# Patient Record
Sex: Female | Born: 2000 | Race: Black or African American | Hispanic: No | Marital: Single | Smoking: Never smoker
Health system: Southern US, Community
[De-identification: ages and names within clinical notes are randomized; demographics above are authoritative.]

## PROBLEM LIST (undated history)

## (undated) DIAGNOSIS — M419 Scoliosis, unspecified: Secondary | ICD-10-CM

## (undated) HISTORY — PX: TUMOR REMOVAL: SHX12

---
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
## 2019-02-09 DIAGNOSIS — N898 Other specified noninflammatory disorders of vagina: Secondary | ICD-10-CM | POA: Diagnosis not present

## 2019-04-02 DIAGNOSIS — Z0000 Encounter for general adult medical examination without abnormal findings: Secondary | ICD-10-CM | POA: Diagnosis not present

## 2019-04-02 DIAGNOSIS — N898 Other specified noninflammatory disorders of vagina: Secondary | ICD-10-CM | POA: Diagnosis not present

## 2019-04-02 DIAGNOSIS — L2082 Flexural eczema: Secondary | ICD-10-CM | POA: Diagnosis not present

## 2019-04-02 DIAGNOSIS — Z3009 Encounter for other general counseling and advice on contraception: Secondary | ICD-10-CM | POA: Diagnosis not present

## 2019-04-05 DIAGNOSIS — Z20828 Contact with and (suspected) exposure to other viral communicable diseases: Secondary | ICD-10-CM | POA: Diagnosis not present

## 2019-04-18 DIAGNOSIS — Z20828 Contact with and (suspected) exposure to other viral communicable diseases: Secondary | ICD-10-CM | POA: Diagnosis not present

## 2019-04-25 DIAGNOSIS — N761 Subacute and chronic vaginitis: Secondary | ICD-10-CM | POA: Diagnosis not present

## 2019-10-09 DIAGNOSIS — B9689 Other specified bacterial agents as the cause of diseases classified elsewhere: Secondary | ICD-10-CM | POA: Diagnosis not present

## 2019-10-09 DIAGNOSIS — Z1159 Encounter for screening for other viral diseases: Secondary | ICD-10-CM | POA: Diagnosis not present

## 2019-10-09 DIAGNOSIS — N760 Acute vaginitis: Secondary | ICD-10-CM | POA: Diagnosis not present

## 2020-01-06 DIAGNOSIS — Z20822 Contact with and (suspected) exposure to covid-19: Secondary | ICD-10-CM | POA: Diagnosis not present

## 2020-02-14 DIAGNOSIS — Z20822 Contact with and (suspected) exposure to covid-19: Secondary | ICD-10-CM | POA: Diagnosis not present

## 2020-03-10 DIAGNOSIS — N760 Acute vaginitis: Secondary | ICD-10-CM | POA: Diagnosis not present

## 2020-03-10 DIAGNOSIS — N898 Other specified noninflammatory disorders of vagina: Secondary | ICD-10-CM | POA: Diagnosis not present

## 2020-03-10 DIAGNOSIS — B9689 Other specified bacterial agents as the cause of diseases classified elsewhere: Secondary | ICD-10-CM | POA: Diagnosis not present

## 2020-03-23 DIAGNOSIS — Z20822 Contact with and (suspected) exposure to covid-19: Secondary | ICD-10-CM | POA: Diagnosis not present

## 2020-03-23 DIAGNOSIS — Z03818 Encounter for observation for suspected exposure to other biological agents ruled out: Secondary | ICD-10-CM | POA: Diagnosis not present

## 2020-03-24 DIAGNOSIS — T7840XA Allergy, unspecified, initial encounter: Secondary | ICD-10-CM | POA: Diagnosis not present

## 2020-03-24 DIAGNOSIS — L509 Urticaria, unspecified: Secondary | ICD-10-CM | POA: Diagnosis not present

## 2020-04-01 DIAGNOSIS — Z0000 Encounter for general adult medical examination without abnormal findings: Secondary | ICD-10-CM | POA: Diagnosis not present

## 2020-04-01 DIAGNOSIS — R636 Underweight: Secondary | ICD-10-CM | POA: Diagnosis not present

## 2020-04-08 DIAGNOSIS — N760 Acute vaginitis: Secondary | ICD-10-CM | POA: Diagnosis not present

## 2020-04-15 DIAGNOSIS — N761 Subacute and chronic vaginitis: Secondary | ICD-10-CM | POA: Diagnosis not present

## 2020-07-08 DIAGNOSIS — Z20822 Contact with and (suspected) exposure to covid-19: Secondary | ICD-10-CM | POA: Diagnosis not present

## 2020-10-24 DIAGNOSIS — Z1159 Encounter for screening for other viral diseases: Secondary | ICD-10-CM | POA: Diagnosis not present

## 2020-10-24 DIAGNOSIS — N898 Other specified noninflammatory disorders of vagina: Secondary | ICD-10-CM | POA: Diagnosis not present

## 2020-11-06 DIAGNOSIS — Z20822 Contact with and (suspected) exposure to covid-19: Secondary | ICD-10-CM | POA: Diagnosis not present

## 2021-04-03 ENCOUNTER — Other Ambulatory Visit (HOSPITAL_COMMUNITY)
Admission: RE | Admit: 2021-04-03 | Discharge: 2021-04-03 | Disposition: A | Discharge status: Home / Self Care | Payer: BC Managed Care – PPO | Source: Ambulatory Visit | Attending: Family Medicine | Admitting: Family Medicine

## 2021-04-03 ENCOUNTER — Other Ambulatory Visit: Payer: Self-pay | Admitting: Family Medicine

## 2021-04-03 DIAGNOSIS — Z01411 Encounter for gynecological examination (general) (routine) with abnormal findings: Secondary | ICD-10-CM | POA: Insufficient documentation

## 2021-04-03 DIAGNOSIS — Z0000 Encounter for general adult medical examination without abnormal findings: Secondary | ICD-10-CM | POA: Diagnosis not present

## 2021-04-03 DIAGNOSIS — Z124 Encounter for screening for malignant neoplasm of cervix: Secondary | ICD-10-CM | POA: Diagnosis not present

## 2021-04-09 LAB — CYTOLOGY - PAP
Adequacy: ABSENT
Comment: NEGATIVE
High risk HPV: NEGATIVE

## 2021-09-07 DIAGNOSIS — Z113 Encounter for screening for infections with a predominantly sexual mode of transmission: Secondary | ICD-10-CM | POA: Diagnosis not present

## 2021-09-07 DIAGNOSIS — Z1159 Encounter for screening for other viral diseases: Secondary | ICD-10-CM | POA: Diagnosis not present

## 2021-09-10 ENCOUNTER — Ambulatory Visit
Admission: RE | Admit: 2021-09-10 | Discharge: 2021-09-10 | Disposition: A | Discharge status: Home / Self Care | Payer: BC Managed Care – PPO | Source: Ambulatory Visit | Attending: Family Medicine | Admitting: Family Medicine

## 2021-09-10 ENCOUNTER — Other Ambulatory Visit: Payer: Self-pay | Admitting: Family Medicine

## 2021-09-10 DIAGNOSIS — R1033 Periumbilical pain: Secondary | ICD-10-CM

## 2021-09-10 DIAGNOSIS — N3289 Other specified disorders of bladder: Secondary | ICD-10-CM | POA: Diagnosis not present

## 2021-09-10 DIAGNOSIS — M4186 Other forms of scoliosis, lumbar region: Secondary | ICD-10-CM | POA: Diagnosis not present

## 2021-09-10 DIAGNOSIS — R188 Other ascites: Secondary | ICD-10-CM | POA: Diagnosis not present

## 2021-09-10 DIAGNOSIS — K769 Liver disease, unspecified: Secondary | ICD-10-CM | POA: Diagnosis not present

## 2021-09-10 IMAGING — CT CT ABD-PELV W/ CM
1 of 2 series · 13 of 32 positions shown, 18 images · IV contrast (APPLIED)
Comparison: None.

CLINICAL DATA: Periumbilical pain for 2 days

EXAM:
CT ABDOMEN AND PELVIS WITH CONTRAST
TECHNIQUE: Multidetector CT imaging of the abdomen and pelvis was performed
using the standard protocol following bolus administration of
intravenous contrast.

[Series 2: abd/pelvis w/cm · axial · 0.62mm/px · z∈[-496,-116]mm · 13 of 88 slices shown, 18 images]
[im 6/88  soft-tissue]
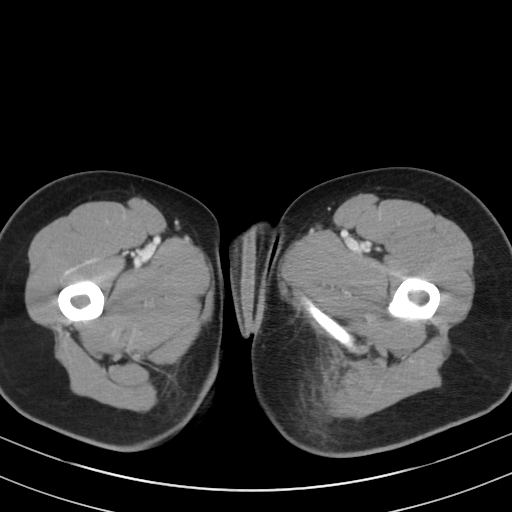
[im 6/88  bone]
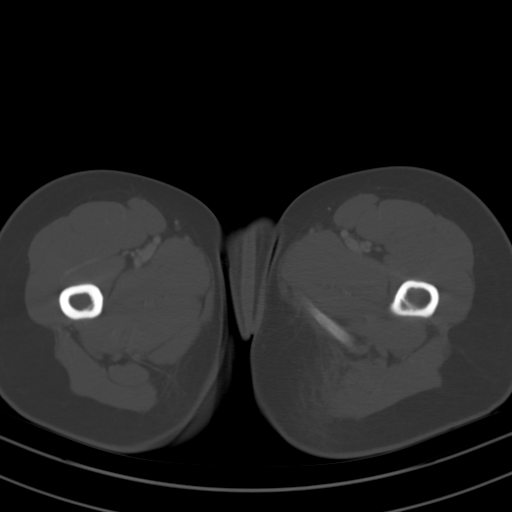
[im 11/88  soft-tissue]
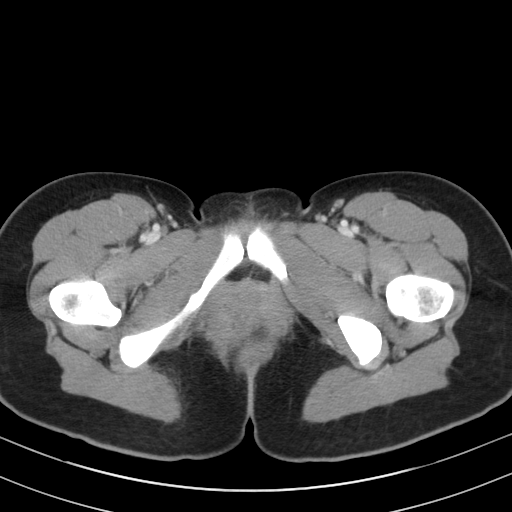
[im 22/88  soft-tissue]
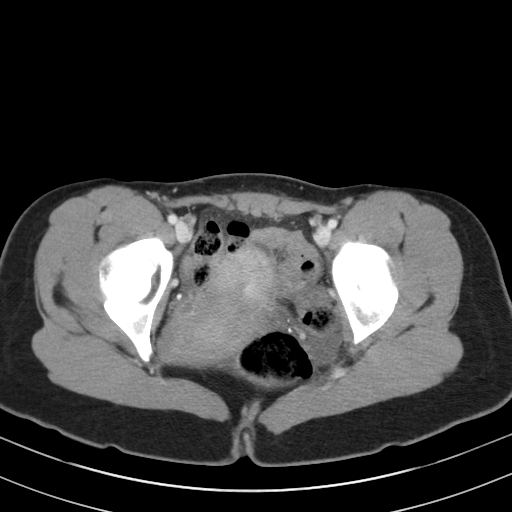
[im 28/88  soft-tissue]
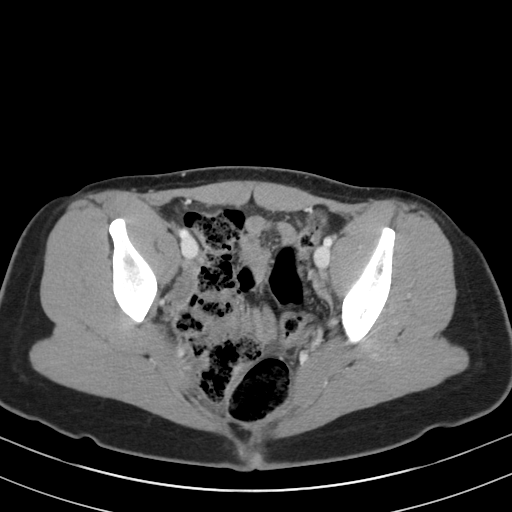
[im 33/88  soft-tissue]
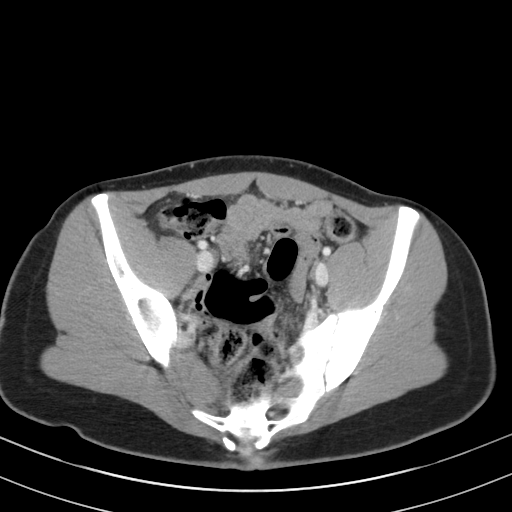
[im 39/88  soft-tissue]
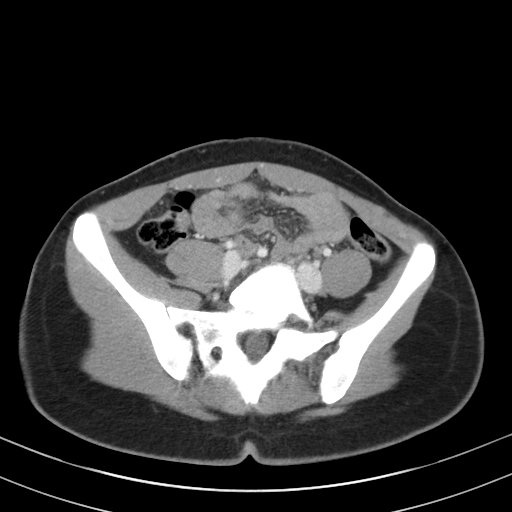
[im 49/88  soft-tissue]
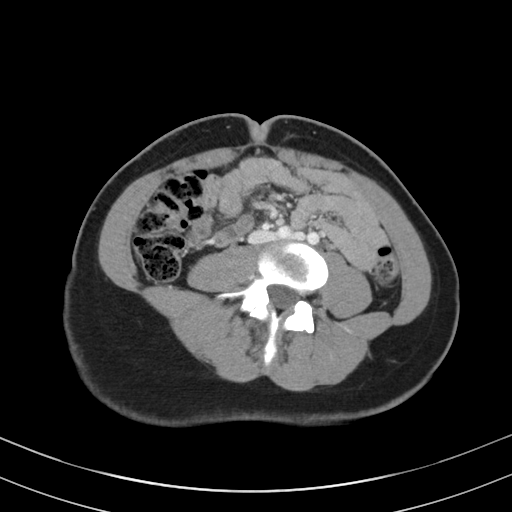
[im 55/88  soft-tissue]
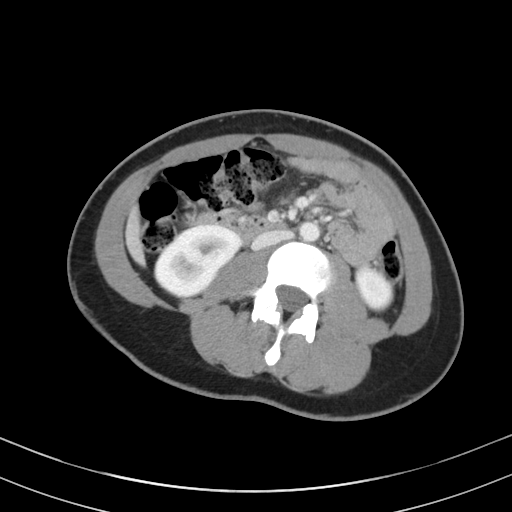
[im 60/88  soft-tissue]
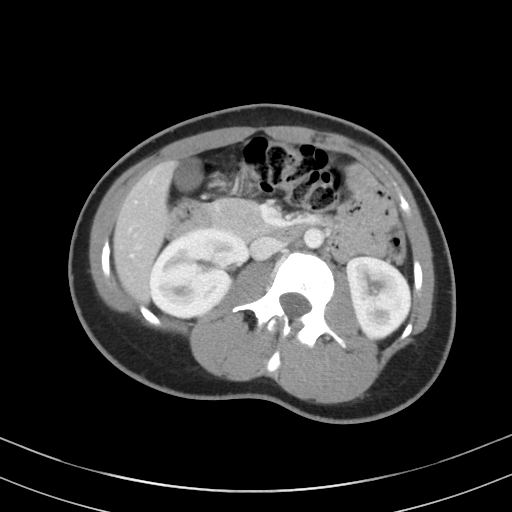
[im 60/88  bone]
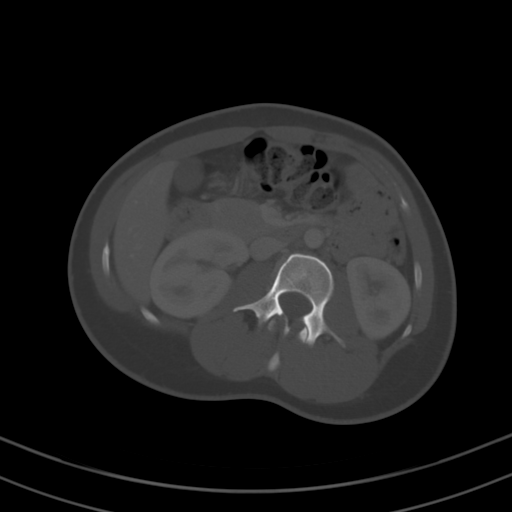
[im 66/88  soft-tissue]
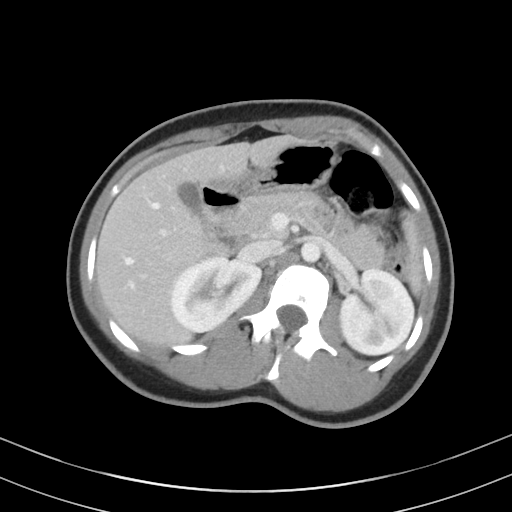
[im 66/88  lung]
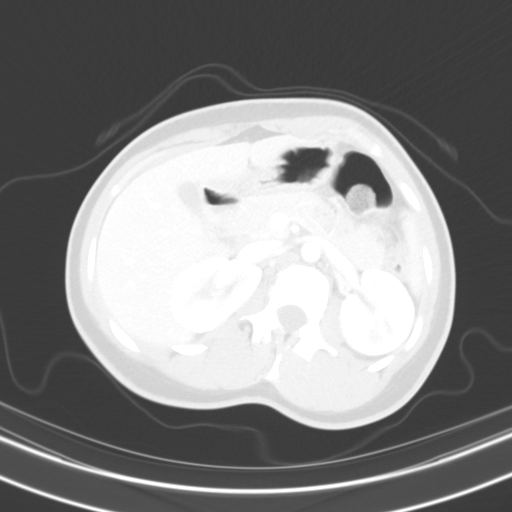
[im 71/88  lung]
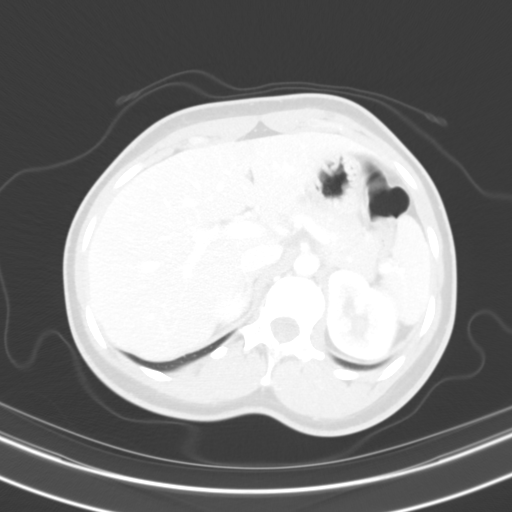
[im 77/88  soft-tissue]
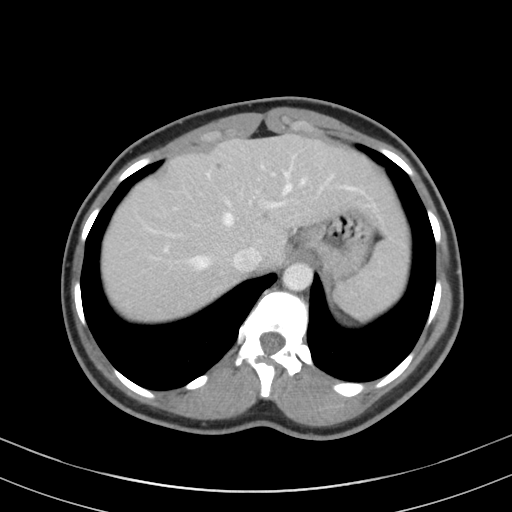
[im 77/88  lung]
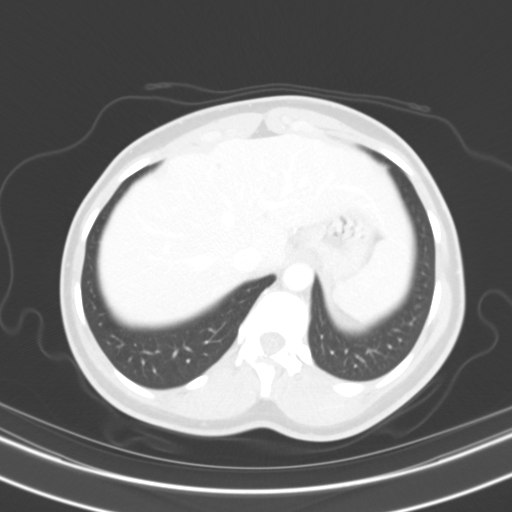
[im 82/88  soft-tissue]
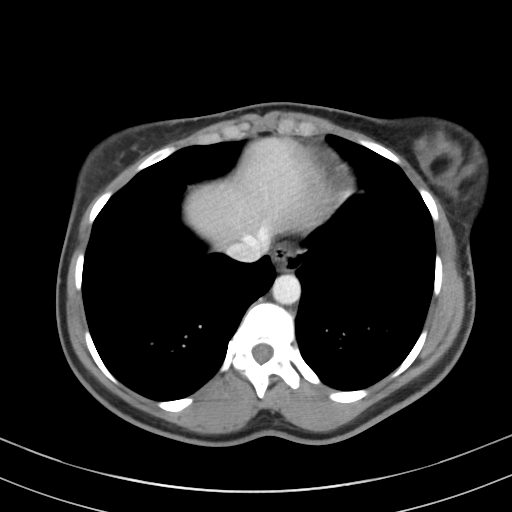
[im 82/88  lung]
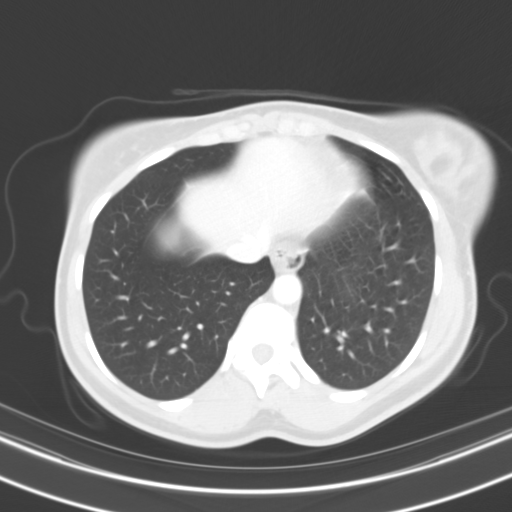

[13 of 32 positions shown; findings below may reference images not displayed]

RADIATION DOSE REDUCTION: This exam was performed according to the
departmental dose-optimization program which includes automated
exposure control, adjustment of the mA and/or kV according to
patient size and/or use of iterative reconstruction technique.

CONTRAST:  100mL [WP] IOPAMIDOL ([WP]) INJECTION 61%
FINDINGS: Lower chest: The lung bases are clear. The imaged heart is
unremarkable.

Hepatobiliary: There are scattered subcentimeter hypodense lesions
in the liver which are too small to characterize but may reflect
small cysts. No specific imaging follow-up is required. There are no
suspicious lesions. The gallbladder is unremarkable. There is no
biliary ductal dilatation.

Pancreas: Unremarkable.

Spleen: Unremarkable.

Adrenals/Urinary Tract: The adrenals are unremarkable.

The kidneys are unremarkable, with no focal lesion, stone,
hydronephrosis, or hydroureter. The bladder is decompressed but the
wall appears mildly thickened.

Stomach/Bowel: The stomach is unremarkable. There is no evidence of
bowel obstruction. There is no abnormal bowel wall thickening or
inflammatory change. Appendix is normal.

Vascular/Lymphatic: The abdominal aorta is normal in course and
caliber. The major branch vessels are patent. The main portal and
splenic veins are patent. There is no abdominopelvic
lymphadenopathy.

Reproductive: The uterus is unremarkable.  There is no adnexal mass.

Other: There is small volume free fluid in the pelvis. There is no
organized or drainable fluid collection. There is no free
intraperitoneal air.

Musculoskeletal: There is marked levoscoliosis centered at L2. There
is no acute osseous abnormality or aggressive osseous lesion.
IMPRESSION: 1. Mild bladder wall thickening may reflect underdistention, though
cystitis can not be excluded. Correlate with urinalysis.
2. Small volume free fluid in the pelvis is nonspecific and may be
physiologic given patient age.
3. Otherwise, no acute findings in the abdomen or pelvis.
4. Marked levoscoliosis centered at L2.

## 2021-09-10 MED ORDER — IOPAMIDOL (ISOVUE-300) INJECTION 61%
100.0000 mL | Freq: Once | INTRAVENOUS | Status: AC | PRN
  Administered 2021-09-10: 100 mL via INTRAVENOUS

## 2021-12-02 DIAGNOSIS — N9089 Other specified noninflammatory disorders of vulva and perineum: Secondary | ICD-10-CM | POA: Diagnosis not present

## 2021-12-02 DIAGNOSIS — Z113 Encounter for screening for infections with a predominantly sexual mode of transmission: Secondary | ICD-10-CM | POA: Diagnosis not present

## 2021-12-02 DIAGNOSIS — Z1159 Encounter for screening for other viral diseases: Secondary | ICD-10-CM | POA: Diagnosis not present

## 2022-04-05 ENCOUNTER — Other Ambulatory Visit (HOSPITAL_COMMUNITY)
Admission: RE | Admit: 2022-04-05 | Discharge: 2022-04-05 | Disposition: A | Discharge status: Home / Self Care | Payer: BC Managed Care – PPO | Source: Ambulatory Visit | Attending: Family Medicine | Admitting: Family Medicine

## 2022-04-05 ENCOUNTER — Other Ambulatory Visit: Payer: Self-pay | Admitting: Family Medicine

## 2022-04-05 DIAGNOSIS — Z01411 Encounter for gynecological examination (general) (routine) with abnormal findings: Secondary | ICD-10-CM | POA: Insufficient documentation

## 2022-04-15 LAB — CYTOLOGY - PAP
Comment: NEGATIVE
Diagnosis: NEGATIVE
High risk HPV: NEGATIVE

## 2022-05-25 ENCOUNTER — Other Ambulatory Visit: Payer: Self-pay

## 2022-05-25 ENCOUNTER — Emergency Department (HOSPITAL_BASED_OUTPATIENT_CLINIC_OR_DEPARTMENT_OTHER)
Admission: EM | Admit: 2022-05-25 | Discharge: 2022-05-25 | Disposition: A | Discharge status: Home / Self Care | Payer: BC Managed Care – PPO | Attending: Emergency Medicine | Admitting: Emergency Medicine

## 2022-05-25 ENCOUNTER — Encounter (HOSPITAL_BASED_OUTPATIENT_CLINIC_OR_DEPARTMENT_OTHER): Payer: Self-pay | Admitting: Emergency Medicine

## 2022-05-25 ENCOUNTER — Emergency Department (HOSPITAL_BASED_OUTPATIENT_CLINIC_OR_DEPARTMENT_OTHER): Payer: BC Managed Care – PPO | Admitting: Radiology

## 2022-05-25 DIAGNOSIS — R053 Chronic cough: Secondary | ICD-10-CM

## 2022-05-25 DIAGNOSIS — R0982 Postnasal drip: Secondary | ICD-10-CM | POA: Insufficient documentation

## 2022-05-25 DIAGNOSIS — R0981 Nasal congestion: Secondary | ICD-10-CM | POA: Insufficient documentation

## 2022-05-25 DIAGNOSIS — J3489 Other specified disorders of nose and nasal sinuses: Secondary | ICD-10-CM | POA: Insufficient documentation

## 2022-05-25 DIAGNOSIS — R059 Cough, unspecified: Secondary | ICD-10-CM | POA: Diagnosis present

## 2022-05-25 DIAGNOSIS — R058 Other specified cough: Secondary | ICD-10-CM | POA: Diagnosis not present

## 2022-05-25 DIAGNOSIS — Z20822 Contact with and (suspected) exposure to covid-19: Secondary | ICD-10-CM | POA: Insufficient documentation

## 2022-05-25 DIAGNOSIS — J029 Acute pharyngitis, unspecified: Secondary | ICD-10-CM | POA: Insufficient documentation

## 2022-05-25 LAB — RESP PANEL BY RT-PCR (RSV, FLU A&B, COVID)  RVPGX2
Influenza A by PCR: NEGATIVE
Influenza B by PCR: NEGATIVE
Resp Syncytial Virus by PCR: NEGATIVE
SARS Coronavirus 2 by RT PCR: NEGATIVE

## 2022-05-25 LAB — PREGNANCY, URINE: Preg Test, Ur: NEGATIVE

## 2022-05-25 MED ORDER — GUAIFENESIN-CODEINE 100-10 MG/5ML PO SOLN: 5.0000 mL | Freq: Three times a day (TID) | ORAL | 0 refills | Status: AC | PRN

## 2022-05-25 MED ORDER — FAMOTIDINE 20 MG PO TABS: 20.0000 mg | ORAL_TABLET | Freq: Every evening | ORAL | 0 refills | Status: AC

## 2022-05-25 MED ORDER — PROMETHAZINE-CODEINE 6.25-10 MG/5ML PO SYRP: 5.0000 mL | ORAL_SOLUTION | Freq: Four times a day (QID) | ORAL | 0 refills | Status: DC | PRN

## 2022-05-25 NOTE — ED Triage Notes (Signed)
Pt arrives to ED with c/o cough x2 weeks. No fevers, chills, night sweats, pain.

## 2022-05-25 NOTE — ED Notes (Signed)
Discharge instructions, follow up care, and prescription reviewed and explained, pt verbalized understanding. Pt caox4 and ambulatory on d/c.  

## 2022-05-25 NOTE — Discharge Instructions (Addendum)
You were seen in the emergency department today for persistent cough.  We have tested you for covid, flu, and RSV and these results were negative.  Your chest X-ray did not show pneumonia or bronchitis.  However, it is still likely that your symptoms are related to a different viral illness.   Treatment is directed at relieving symptoms.  There is no cure for acute viral infections; it is usually best to let them run their course.  Symptoms usually last around 1-2 weeks, though cough may occasionally linger for up to 3-4 weeks.  As discussed, antibiotics are not effective with viral infections, because the infection is caused by a virus, not by bacteria.   Treatments may include:  Increased fluid intake. Sports drinks offer valuable electrolytes, sugars, and fluids.  Breathing heated mist or steam (vaporizer or shower).  Eating chicken soup or other clear broths, and maintaining good nutrition.  Getting plenty of rest.  Using lozenges, tea with honey, or warm salt water for cough relief/sore throat relief (Cepkaol or Halls lozenges are available over-the-counter) Increasing usage of your inhaler if you have asthma.   Take over-the-counter Benadryl, Zyrtec, or Claritin to decrease sinus secretions, with Mucinex to help break up remaining mucus Continue to alternate between Tylenol and ibuprofen for pain and fever control.  A prescription for additional cough suppressant therapy and for an antacid has been sent to the pharmacy.  Please use as directed, and do not drive or operate machinery after using the cough suppressant medication as it can cause drowsiness and has opioid medication within.  Please follow up with your primary care doctor this week for recheck of ongoing symptoms.  May consider follow up with pulmonology for consideration of post-viral cough syndrome of further evaluation as needed.  Return to emergency department for emergent changing or worsening of symptoms.

## 2022-05-25 NOTE — ED Notes (Signed)
Out to XR 

## 2022-05-25 NOTE — ED Provider Notes (Signed)
[facility] EMERGENCY DEPT Provider Note   CSN: [id number] Arrival date & time: 05/25/22  1412     History  Chief Complaint  Patient presents with   Cough    NYKEA JACEK is a 21 y.o. female presenting to the ED with chief complaint of prolonged cough over the last 2 weeks.  Denies preceding or earlier URI symptoms such as congestion, sore throat, runny nose, postnasal drip.  No known recent sick contacts.  States cough varies between productive and dry.  When productive, mucus is a whitish-green in color.  Denies abdominal pain, N/V/D, chills, fevers, or neck stiffness.  Denies headache or sinus pressure.  Has tried Mucinex, Robitussin, NyQuil, DayQuil without relief.  No Hx of asthma, denies wheezing or shortness of breath.  Denies chest pain.  No other complaints at this time.  The history is provided by the patient and medical records.  Cough      Home Medications Prior to Admission medications   Medication Sig Start Date End Date Taking? Authorizing Provider  famotidine (PEPCID) 20 MG tablet Take 1 tablet (20 mg total) by mouth at bedtime. 05/25/22  Yes Cecil Cobbs, PA-C  guaiFENesin-codeine 100-10 MG/5ML syrup Take 5 mLs by mouth 3 (three) times daily as needed for cough. 05/25/22  Yes Cecil Cobbs, PA-C      Allergies    Pineapple    Review of Systems   Review of Systems  Respiratory:  Positive for cough.     Physical Exam Updated Vital Signs BP (!) 127/90 (BP Location: Right Arm)   Pulse 86   Temp 98.5 F (36.9 C) (Oral)   Resp 16   Ht 5\' 4"  (1.626 m)   Wt 58.1 kg   SpO2 96%   BMI 21.97 kg/m  Physical Exam Vitals and nursing note reviewed.  Constitutional:      General: She is not in acute distress.    Appearance: She is well-developed. She is not ill-appearing, toxic-appearing or diaphoretic.  HENT:     Head: Normocephalic and atraumatic.     Right Ear: Tympanic membrane, ear canal and external ear normal.     Left  Ear: Tympanic membrane, ear canal and external ear normal.     Nose: Rhinorrhea (Mild) present.     Mouth/Throat:     Mouth: Mucous membranes are moist.     Pharynx: Oropharynx is clear. No oropharyngeal exudate or posterior oropharyngeal erythema.  Eyes:     Conjunctiva/sclera: Conjunctivae normal.  Cardiovascular:     Rate and Rhythm: Normal rate and regular rhythm.     Heart sounds: No murmur heard. Pulmonary:     Effort: Pulmonary effort is normal. No respiratory distress.     Breath sounds: Normal breath sounds.  Abdominal:     Palpations: Abdomen is soft.     Tenderness: There is no abdominal tenderness.  Musculoskeletal:        General: No swelling.     Cervical back: Neck supple.  Skin:    General: Skin is warm and dry.     Capillary Refill: Capillary refill takes less than 2 seconds.  Neurological:     Mental Status: She is alert.  Psychiatric:        Mood and Affect: Mood normal.     ED Results / Procedures / Treatments   Labs (all labs ordered are listed, but only abnormal results are displayed) Labs Reviewed  RESP PANEL BY RT-PCR (RSV, FLU A&B, COVID)  RVPGX2  PREGNANCY, URINE    EKG None  Radiology DG Chest 2 View  Result Date: 05/25/2022 CLINICAL DATA:  Cough over the last 2 weeks EXAM: CHEST - 2 VIEW COMPARISON:  None FINDINGS: Heart and mediastinal shadows are normal. The lungs are clear. No infiltrate, collapse or effusion. There is scoliosis of the spine, convex to the right, Cobb angle estimated at 18 degrees. IMPRESSION: No active cardiopulmonary disease. Scoliosis, convex to the right, Cobb angle 18 degrees. Electronically Signed   By: Paulina Fusi M.D.   On: 05/25/2022 15:47    Procedures Procedures    Medications Ordered in ED Medications - No data to display  ED Course/ Medical Decision Making/ A&P                           Medical Decision Making Amount and/or Complexity of Data Reviewed Labs: ordered. Radiology:  ordered.  Risk OTC drugs.   21 y.o. female presents to the ED for concern of Cough   This involves an extensive number of treatment options, and is a complaint that carries with it a high risk of complications and morbidity.  The emergent differential diagnosis prior to evaluation includes, but is not limited to: Viral pharyngitis, bacterial pharyngitis, pneumonia, bronchitis, GERD  This is not an exhaustive differential.   Past Medical History / Co-morbidities / Social History: Hx of scoliosis Social Determinants of Health include: None  Additional History:  None  Lab Tests: I ordered, and personally interpreted labs.  The pertinent results include:   COVID, flu, RSV negative Negative urine pregnancy  Imaging Studies: I ordered imaging studies including CXR.   I independently visualized and interpreted imaging which showed no acute cardiopulmonary pathology I agree with the radiologist interpretation.  ED Course: Pt well-appearing on exam.  Sitting comfortably.  Nontoxic, nonseptic appearing in NAD.  Presenting with persistent cough over the last 2 weeks.  Occasionally productive, with greenish-yellow phlegm.  No other URI symptoms other than some mild rhinorrhea appreciated on exam.  Can be worse after meals or with stress.   Not on ACE inhibitor or ARB for blood pressure.   No angioedema on exam.  No rash appreciated.  No clinical evidence of otitis media, otitis externa, mastoiditis.  No tonsillar erythema, edema, or exudate appreciated.  No cervical lymphadenopathy appreciated.  Able to swallow and handle secretions without difficulty.  No hx of asthma.  Lungs CTAB, without increased respiratory effort or evidence of respiratory distress.  Adequate air movement.  No wheezing, shortness of breath or chest pain.  Satting at 100% on room air, sitting comfortably.  Hemodynamically stable. Tested negative for covid, flu, and RSV.  Doubt strep pharyngitis.  CXR negative for pneumonia,  bronchitis, pneumothorax, or pleural effusion.   Overall, I am uncertain the exact etiology of the patient's symptoms.  However, I do not believe she is currently experiencing a medical, surgical, or psychiatric emergency.  Suspicious for post viral cough syndrome vs GERD.  Recommend close follow up with PCP.  May consider follow up with pulmonology/GI for further assessment/management.  Prescription for cough suppressant and antacid sent to pharmacy.  Pt reports satisfaction with today's encounter.  Patient in NAD and in good condition at time of discharge.  Disposition: After consideration the patient's encounter today, I do not feel today's workup suggests an emergent condition requiring admission or immediate intervention beyond what has been performed at this time.  Safe for discharge; instructed to return immediately for  worsening symptoms, change in symptoms or any other concerns.  I have reviewed the patients home medicines and have made adjustments as needed.  Discussed course of treatment with the patient, whom demonstrated understanding.  Patient in agreement and has no further questions.    This chart was dictated using voice recognition software.  Despite best efforts to proofread, errors can occur which can change the documentation meaning.         Final Clinical Impression(s) / ED Diagnoses Final diagnoses:  Persistent cough    Rx / DC Orders ED Discharge Orders          Ordered    promethazine-codeine (PHENERGAN WITH CODEINE) 6.25-10 MG/5ML syrup  Every 6 hours PRN,   Status:  Discontinued        05/25/22 1616    famotidine (PEPCID) 20 MG tablet  Nightly        05/25/22 1619    guaiFENesin-codeine 100-10 MG/5ML syrup  3 times daily PRN        05/25/22 1719              Cecil Cobbs, PA-C 05/26/22 0817    Melene Plan, DO 05/26/22 2256

## 2023-05-12 ENCOUNTER — Emergency Department (HOSPITAL_BASED_OUTPATIENT_CLINIC_OR_DEPARTMENT_OTHER)
Admission: EM | Admit: 2023-05-12 | Discharge: 2023-05-12 | Disposition: A | Discharge status: Home / Self Care | Payer: BC Managed Care – PPO | Attending: Emergency Medicine | Admitting: Emergency Medicine

## 2023-05-12 ENCOUNTER — Other Ambulatory Visit: Payer: Self-pay

## 2023-05-12 DIAGNOSIS — J060 Acute laryngopharyngitis: Secondary | ICD-10-CM | POA: Insufficient documentation

## 2023-05-12 DIAGNOSIS — Z1152 Encounter for screening for COVID-19: Secondary | ICD-10-CM | POA: Insufficient documentation

## 2023-05-12 DIAGNOSIS — J029 Acute pharyngitis, unspecified: Secondary | ICD-10-CM | POA: Diagnosis present

## 2023-05-12 LAB — RESP PANEL BY RT-PCR (RSV, FLU A&B, COVID)  RVPGX2
Influenza A by PCR: NEGATIVE
Influenza B by PCR: NEGATIVE
Resp Syncytial Virus by PCR: NEGATIVE
SARS Coronavirus 2 by RT PCR: NEGATIVE

## 2023-05-12 LAB — GROUP A STREP BY PCR: Group A Strep by PCR: NOT DETECTED

## 2023-05-12 MED ORDER — DEXAMETHASONE 4 MG PO TABS
10.0000 mg | ORAL_TABLET | Freq: Once | ORAL | Status: AC
  Administered 2023-05-12: 10 mg via ORAL
  Filled 2023-05-12: qty 3

## 2023-05-12 MED ORDER — DEXAMETHASONE 1 MG/ML PO CONC
10.0000 mg | Freq: Once | ORAL | Status: DC
  Filled 2023-05-12: qty 10

## 2023-05-12 MED ORDER — IBUPROFEN 400 MG PO TABS
600.0000 mg | ORAL_TABLET | Freq: Once | ORAL | Status: AC
  Administered 2023-05-12: 600 mg via ORAL
  Filled 2023-05-12: qty 1

## 2023-05-12 MED ORDER — LIDOCAINE VISCOUS HCL 2 % MT SOLN
15.0000 mL | Freq: Once | OROMUCOSAL | Status: AC
  Administered 2023-05-12: 15 mL via OROMUCOSAL
  Filled 2023-05-12: qty 15

## 2023-05-12 NOTE — ED Notes (Signed)
Pt given fluids... No N/V... Provider informed.Marland KitchenMarland Kitchen

## 2023-05-12 NOTE — ED Notes (Signed)
Discharge paperwork given and verbally understood.

## 2023-05-12 NOTE — Discharge Instructions (Signed)
As discussed, symptoms likely secondary to viral URI.  Your strep test was negative.  Your COVID, flu, RSV test is negative.  We have treated you on the emergency department with anti-inflammatory along with a steroid.  The steroid will last over the next few days to help treat any inflammation of your throat as well as voicebox.  Recommend continue use of anti-inflammatories at home in the form of ibuprofen/Aleve for treatment of pain along with continued at home remedies as discussed.  Please do not hesitate to return if you develop fever, worsening pain, difficulty swallowing/breathing.

## 2023-05-12 NOTE — ED Triage Notes (Signed)
Pt reports sore throat since Sunday, worsening yesterday with hoarse voice.  Denies known fever at home, denies chest or sinus congestion or cough.

## 2023-05-12 NOTE — ED Provider Notes (Signed)
[facility] AT [facility] Provider Note   CSN: [id number] Arrival date & time: 05/12/23  1025     History  Chief Complaint  Patient presents with   Sore Throat    Laura Carrillo is a 22 y.o. female.  HPI   22 year old female presents emergency department with complaints of sore throat, losing voice.  Patient states that she began with "tickle in throat" on Sunday.  States that she was feeling better on the next day or 2 before symptoms returned yesterday.  Reports slightly worsening sore throat and then subsequently losing her voice.  Has been trying over-the-counter medications with some improvement.  Denies any fever, nasal congestion, cough, ear pain.  Denies any known sick contact.  Denies any difficulty breathing/swallowing.  No significant pertinent past medical history.  Home Medications Prior to Admission medications   Medication Sig Start Date End Date Taking? Authorizing Provider  famotidine (PEPCID) 20 MG tablet Take 1 tablet (20 mg total) by mouth at bedtime. 05/25/22   Cecil Cobbs, PA-C  guaiFENesin-codeine 100-10 MG/5ML syrup Take 5 mLs by mouth 3 (three) times daily as needed for cough. 05/25/22   Cecil Cobbs, PA-C      Allergies    Pineapple    Review of Systems   Review of Systems  All other systems reviewed and are negative.   Physical Exam Updated Vital Signs BP (!) 140/98 (BP Location: Right Arm)   Pulse 88   Temp 98.7 F (37.1 C) (Oral)   Resp 16   LMP 03/09/2023 (Approximate) Comment: Pt states she has skipped recent MP by skipping placebo birth control pills  SpO2 98%  Physical Exam Vitals and nursing note reviewed.  Constitutional:      General: She is not in acute distress.    Appearance: She is well-developed.  HENT:     Head: Normocephalic and atraumatic.     Right Ear: Tympanic membrane normal.     Left Ear: Tympanic membrane normal.     Nose: Nose normal.     Mouth/Throat:      Comments: Mild posterior pharyngeal erythema.  Uvula midline right symmetric with phonation.  No sublingual extremity but or swelling.  Tonsils 1+ bilaterally without exudate.  No trismus.  Hoarse voice appreciated. Eyes:     Conjunctiva/sclera: Conjunctivae normal.  Cardiovascular:     Rate and Rhythm: Normal rate and regular rhythm.     Heart sounds: No murmur heard. Pulmonary:     Effort: Pulmonary effort is normal. No respiratory distress.     Breath sounds: Normal breath sounds. No wheezing, rhonchi or rales.  Abdominal:     Palpations: Abdomen is soft.     Tenderness: There is no abdominal tenderness.  Musculoskeletal:        General: No swelling.     Cervical back: Neck supple.  Skin:    General: Skin is warm and dry.     Capillary Refill: Capillary refill takes less than 2 seconds.  Neurological:     Mental Status: She is alert.  Psychiatric:        Mood and Affect: Mood normal.     ED Results / Procedures / Treatments   Labs (all labs ordered are listed, but only abnormal results are displayed) Labs Reviewed  GROUP A STREP BY PCR  RESP PANEL BY RT-PCR (RSV, FLU A&B, COVID)  [id number]    EKG None  Radiology No results found.  Procedures Procedures    Medications  Ordered in ED Medications  lidocaine (XYLOCAINE) 2 % viscous mouth solution 15 mL (15 mLs Mouth/Throat Given 05/12/23 1056)  ibuprofen (ADVIL) tablet 600 mg (600 mg Oral Given 05/12/23 1055)  dexamethasone (DECADRON) tablet 10 mg (10 mg Oral Given 05/12/23 1054)    ED Course/ Medical Decision Making/ A&P                                 Medical Decision Making Risk Prescription drug management.   This patient presents to the ED for concern of sore throat, this involves an extensive number of treatment options, and is a complaint that carries with it a high risk of complications and morbidity.  The differential diagnosis includes viral pharyngitis, strep, Ludwig angina, retropharyngeal abscess,  Lemierre's disease, PTA, other   Co morbidities that complicate the patient evaluation  See HPI   Additional history obtained:  Additional history obtained from EMR External records from outside source obtained and reviewed including hospital records   Lab Tests:  I Ordered, and personally interpreted labs.  The pertinent results include: Respiratory viral panel negative.  Group A strep negative.   Imaging Studies ordered:  N/a   Cardiac Monitoring: / EKG:  The patient was maintained on a cardiac monitor.  I personally viewed and interpreted the cardiac monitored which showed an underlying rhythm of: Sinus rhythm   Consultations Obtained:  N/a   Problem List / ED Course / Critical interventions / Medication management  Sore throat/hoarse voice I ordered medication including Decadron, Motrin, viscous lidocaine   Reevaluation of the patient after these medicines showed that the patient improved I have reviewed the patients home medicines and have made adjustments as needed   Social Determinants of Health:  Denies tobacco, illicit drug use/exposure   Test / Admission - Considered:  Sore throat, hoarse voice Vitals signs within normal range and stable throughout visit. Laboratory/imaging studies significant for: See above 22 year old female presents emergency department complaints of sore throat.  Sore throat present since Sunday with some improvement before worsening over the past couple of days.  Patient also with development of hoarse voice.  Otherwise, denies any other symptoms.  On exam, patient with posterior pharyngeal erythema but no clinical evidence of Ludwig angina, PTA, retropharyngeal abscess.  Patient was with hoarse voice.  Strep negative.  Respiratory viral panel negative.  Suspect the patient symptoms are likely secondary to viral URI.  Will recommend continued symptomatic therapy as described in AVS.  Patient overall well-appearing, afebrile in no  acute distress, tolerating p.o. without difficulty..  Treatment plan discussed at length with patient and she acknowledged understand was agreeable to said plan.   Worrisome signs and symptoms were discussed with the patient, and the patient acknowledged understanding to return to the ED if noticed. Patient was stable upon discharge.          Final Clinical Impression(s) / ED Diagnoses Final diagnoses:  Sore throat and laryngitis    Rx / DC Orders ED Discharge Orders     None         Peter Garter, [place] 05/12/23 1151    Rondel Baton, MD 05/12/23 [phone]
